# Patient Record
Sex: Male | Born: 1950 | Race: White | Hispanic: No | Marital: Married | Smoking: Never smoker
Health system: Southern US, Community
[De-identification: ages and names within clinical notes are randomized; demographics above are authoritative.]

## PROBLEM LIST (undated history)

## (undated) HISTORY — PX: NO PAST SURGERIES: SHX2092

---

## 2006-02-28 ENCOUNTER — Ambulatory Visit: Payer: Self-pay | Admitting: Family Medicine

## 2006-05-02 ENCOUNTER — Ambulatory Visit: Payer: Self-pay | Admitting: Gastroenterology

## 2018-03-17 ENCOUNTER — Other Ambulatory Visit: Payer: Self-pay

## 2018-03-17 ENCOUNTER — Ambulatory Visit (INDEPENDENT_AMBULATORY_CARE_PROVIDER_SITE_OTHER): Payer: Medicare Other

## 2018-03-17 ENCOUNTER — Ambulatory Visit
Admission: EM | Admit: 2018-03-17 | Discharge: 2018-03-17 | Disposition: A | Discharge status: Home / Self Care | Payer: Medicare Other | Attending: Emergency Medicine | Admitting: Emergency Medicine

## 2018-03-17 DIAGNOSIS — W01198A Fall on same level from slipping, tripping and stumbling with subsequent striking against other object, initial encounter: Secondary | ICD-10-CM

## 2018-03-17 DIAGNOSIS — Y92009 Unspecified place in unspecified non-institutional (private) residence as the place of occurrence of the external cause: Secondary | ICD-10-CM

## 2018-03-17 DIAGNOSIS — S20212A Contusion of left front wall of thorax, initial encounter: Secondary | ICD-10-CM | POA: Diagnosis not present

## 2018-03-17 DIAGNOSIS — W19XXXA Unspecified fall, initial encounter: Secondary | ICD-10-CM

## 2018-03-17 DIAGNOSIS — R0789 Other chest pain: Secondary | ICD-10-CM

## 2018-03-17 DIAGNOSIS — R0781 Pleurodynia: Secondary | ICD-10-CM

## 2018-03-17 DIAGNOSIS — Y92002 Bathroom of unspecified non-institutional (private) residence single-family (private) house as the place of occurrence of the external cause: Secondary | ICD-10-CM | POA: Diagnosis not present

## 2018-03-17 MED ORDER — IBUPROFEN 600 MG PO TABS: 600.0000 mg | ORAL_TABLET | Freq: Four times a day (QID) | ORAL | 0 refills | Status: AC | PRN

## 2018-03-17 MED ORDER — HYDROCODONE-ACETAMINOPHEN 5-325 MG PO TABS: 1.0000 | ORAL_TABLET | Freq: Four times a day (QID) | ORAL | 0 refills | Status: AC | PRN

## 2018-03-17 NOTE — Discharge Instructions (Addendum)
Your x-rays were negative for a displaced rib fracture or a collapsed lung.  Take the medication as written. Take 1 gram of tylenol with the motrin up to 4 times a day as needed for pain and fever. This with the is an effective combination for pain. Take the hydrocodone/norco only for severe pain. Do not take the tylenol and hydrocodone/norco as they both have tylenol in them and too much can hurt your liver. Do not exceed 4 grams of tylenol a day from all sources. Return to the ER if you get worse, have a  fever >100.4, or for any concerns.   Go to www.goodrx.com to look up your medications. This will give you a list of where you can find your prescriptions at the most affordable prices. Or ask the pharmacist what the cash price is, or if they have any other discount programs available to help make your medication more affordable. This can be less expensive than what you would pay with insurance.

## 2018-03-17 NOTE — ED Provider Notes (Signed)
HPI  SUBJECTIVE:  Joshua Mcmahon is a 67 y.o. male who presents with a slip and fall in the shower 4 days ago.  States he fell onto His left ribs.  Reports sharp, intermittent side and posterior left rib pain present only with movement, torso rotation, inspiration and cough.  States that the symptoms started last night.  He is not sure if he hit his head but he denies loss of consciousness, amnesia, nausea, vomiting, headache, photophobia.  No neck, chest, abdominal, back pain.  No coughing, wheezing, shortness of breath, fevers.  Patient states that he has noticed a "knot on his ribs" which is new.  He thinks that he may see some bruising.  He has not tried anything for this.  Symptoms are better with sitting still, worse with torso rotation, movement, deep inspiration.  Past medical history negative for diabetes, hypertension, osteoarthritis, anticoagulant antiplatelet use, kidney disease, GI bleed.  PMD: Kandyce Rud, MD   History reviewed. No pertinent past medical history.  Past Surgical History:  Procedure Laterality Date  . NO PAST SURGERIES      History reviewed. No pertinent family history.  Social History   Tobacco Use  . Smoking status: Never Smoker  . Smokeless tobacco: Current User    Types: Chew  Substance Use Topics  . Alcohol use: Yes    Comment: occasionally  . Drug use: Never    No current facility-administered medications for this encounter.   Current Outpatient Medications:  .  HYDROcodone-acetaminophen (NORCO/VICODIN) 5-325 MG tablet, Take 1-2 tablets by mouth every 6 (six) hours as needed for moderate pain or severe pain., Disp: 20 tablet, Rfl: 0 .  ibuprofen (ADVIL,MOTRIN) 600 MG tablet, Take 1 tablet (600 mg total) by mouth every 6 (six) hours as needed., Disp: 30 tablet, Rfl: 0  No Known Allergies   ROS  As noted in HPI.   Physical Exam  BP (!) 163/79 (BP Location: Left Arm)   Pulse 85   Temp 98.7 F (37.1 C) (Oral)   Resp 18   Ht 6\' 4"   (1.93 m)   Wt 265 lb (120.2 kg)   SpO2 98%   BMI 32.26 kg/m   Constitutional: Well developed, well nourished, no acute distress Eyes:  EOMI, conjunctiva normal bilaterally HENT: Normocephalic, atraumatic,mucus membranes moist Respiratory: Normal inspiratory effort.  Normal appearance of the chest wall.  No appreciable bruising.  Positive point tenderness over some of the middle and lower ribs in the midaxillary line and posteriorly.  No paradoxical chest wall motion.  Patient able to take a deep breath in.  Lungs clear bilaterally.  No other chest tenderness. Cardiovascular: Normal rate regular rhythm, no murmurs, rubs, gallops GI: nondistended skin: No rash, skin intact Musculoskeletal: no deformities Neurologic: Alert & oriented x 3, no focal neuro deficits Psychiatric: Speech and behavior appropriate   ED Course   Medications - No data to display  Orders Placed This Encounter  Procedures  . DG Ribs Unilateral W/Chest Left    Standing Status:   Standing    Number of Occurrences:   1    Order Specific Question:   Reason for Exam (SYMPTOM  OR DIAGNOSIS REQUIRED)    Answer:   Fall, trauma to the chest.  Tenderness over the posterior mid axillary line.  Rule out displaced rib fractures, rib fractures, pneumothorax.    No results found for this or any previous visit (from the past 24 hour(s)). Dg Ribs Unilateral W/chest Left  Result Date: 03/17/2018 CLINICAL  DATA:  Trauma to left side. Pain and tenderness since Sunday. EXAM: LEFT RIBS AND CHEST - 3+ VIEW COMPARISON:  None. FINDINGS: Frontal view chest and three views of left-sided ribs. Frontal view of the chest demonstrates midline trachea. Normal heart size. Atherosclerosis in the transverse aorta. No pleural effusion or pneumothorax. Mild right hemidiaphragm elevation. Left mid lobe scar or subsegmental atelectasis. Rib films demonstrate no displaced rib fracture. IMPRESSION: No displaced rib fractures, pleural fluid, or  pneumothorax. Aortic Atherosclerosis (ICD10-I70.0). Electronically Signed   By: Jeronimo GreavesKyle  Talbot M.D.   On: 03/17/2018 15:16    ED Clinical Impression  Contusion of rib on left side, initial encounter  Fall in home, initial encounter   ED Assessment/Plan   Narcotic database reviewed for this patient, and feel that the risk/benefit ratio today is favorable for proceeding with a prescription for controlled substance.  No Opiate prescriptions in the past 2 years  Reviewed imaging independently.  No displaced rib fractures, pleural fluid or pneumothorax.  See radiology report for full details. X-ray negative for fracture, pneumothorax.  Presentation consistent with either rib contusion or he may have a hairline fracture of the ribs.  Home with ibuprofen 600 mg to take with 1 g of Tylenol 3-4 times a day as needed for pain, Norco for severe pain.  Follow-up with PMD as needed, to the ER if he gets worse.  Discussed maging, MDM, plan and followup with patient. Discussed sn/sx that should prompt return to the ED. patient agrees with plan.   Meds ordered this encounter  Medications  . ibuprofen (ADVIL,MOTRIN) 600 MG tablet    Sig: Take 1 tablet (600 mg total) by mouth every 6 (six) hours as needed.    Dispense:  30 tablet    Refill:  0  . HYDROcodone-acetaminophen (NORCO/VICODIN) 5-325 MG tablet    Sig: Take 1-2 tablets by mouth every 6 (six) hours as needed for moderate pain or severe pain.    Dispense:  20 tablet    Refill:  0    *This clinic note was created using Scientist, clinical (histocompatibility and immunogenetics)Dragon dictation software. Therefore, there may be occasional mistakes despite careful proofreading.   ?   Domenick GongMortenson, Titilayo Hagans, MD 03/17/18 [phone]

## 2018-03-17 NOTE — ED Triage Notes (Signed)
Patient complains of left side rib pain that started hurting last night. Patient states that he was in [place] the [place] and slipped and fell in the cast iron tub. Patient states that he also has a knot on the back of his head. Patient reports that rib pain worsens with movement. Patient reports no pain from head bump.

## 2019-07-20 IMAGING — CR DG RIBS W/ CHEST 3+V*L*
5 series · 5 of 5 positions shown · non-contrast
Comparison: None.

CLINICAL DATA: Trauma to left side. Pain and tenderness since
[REDACTED].

EXAM:
LEFT RIBS AND CHEST - 3+ VIEW

[chest pa]
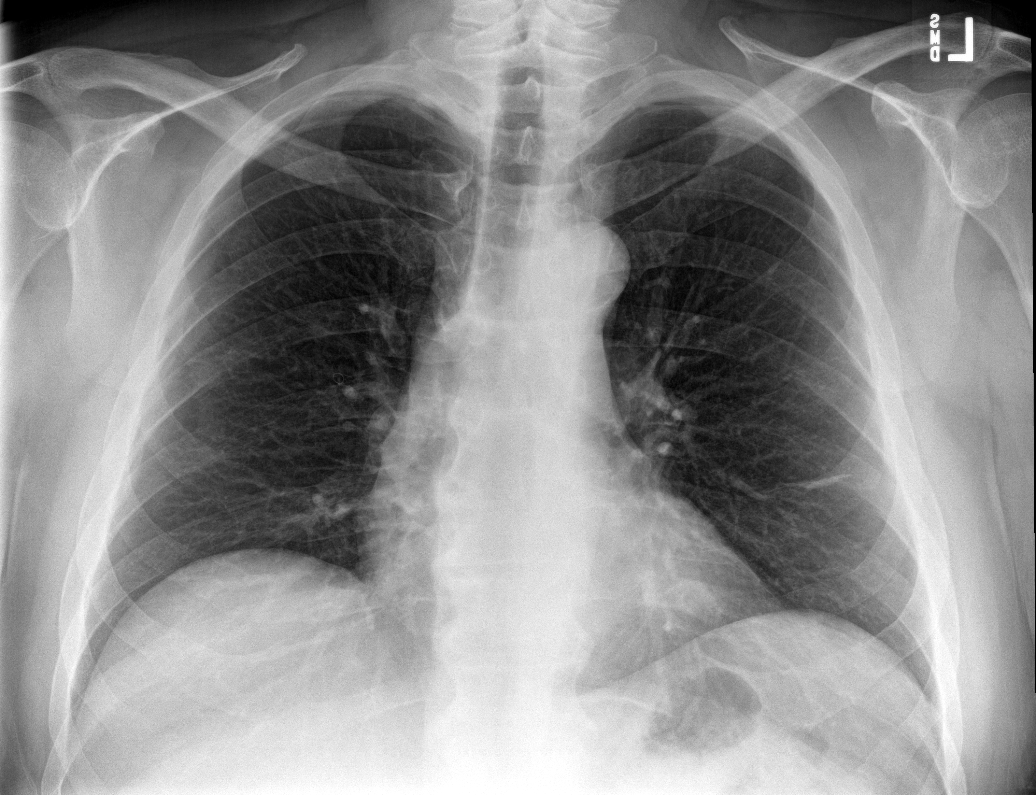

[rib pa (1 of 2)]
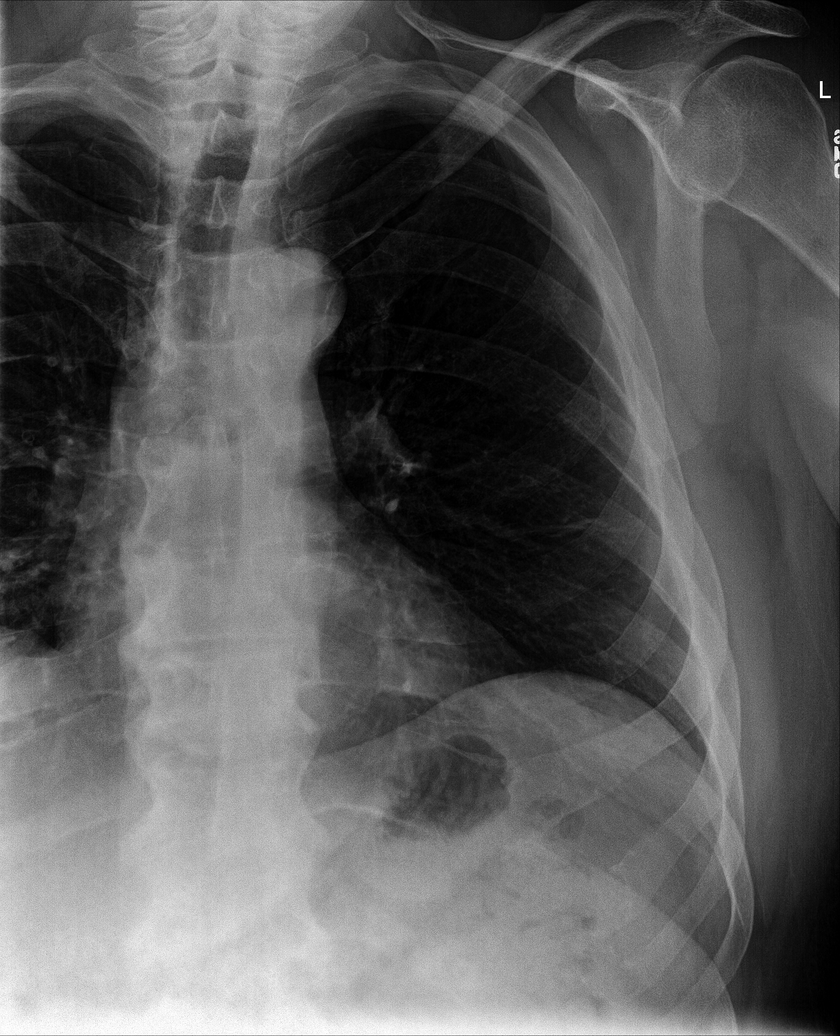

[rib pa (2 of 2)]
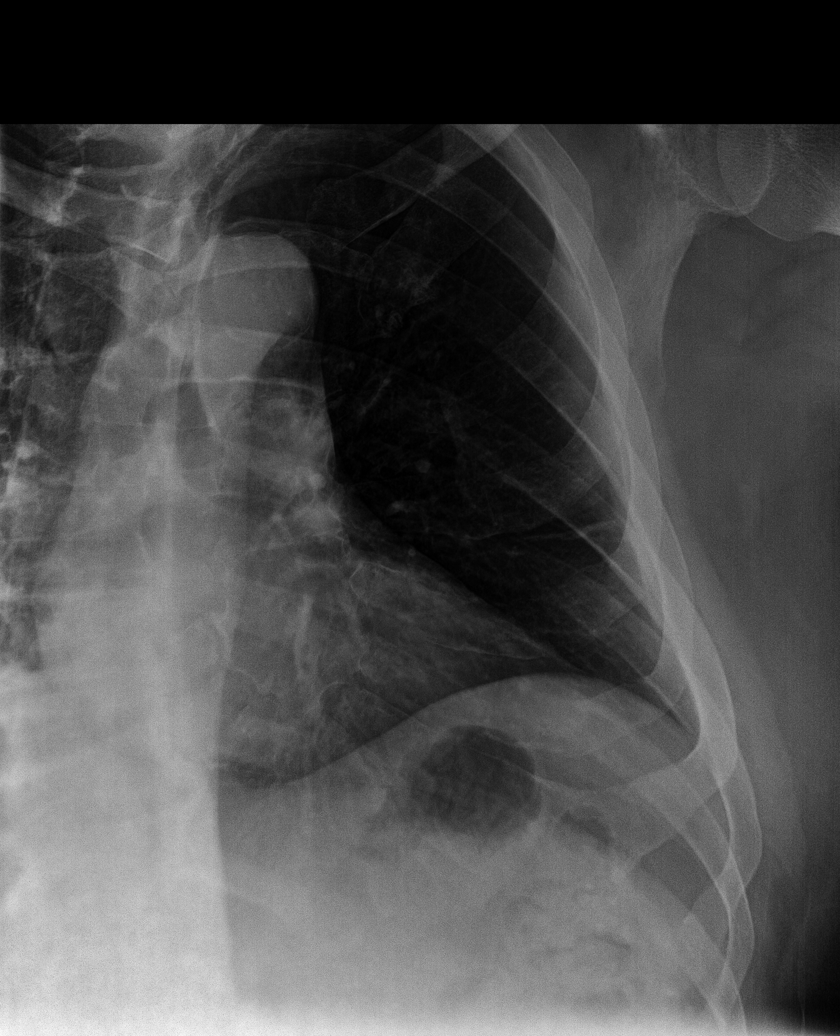

[rib obl (1 of 2)]
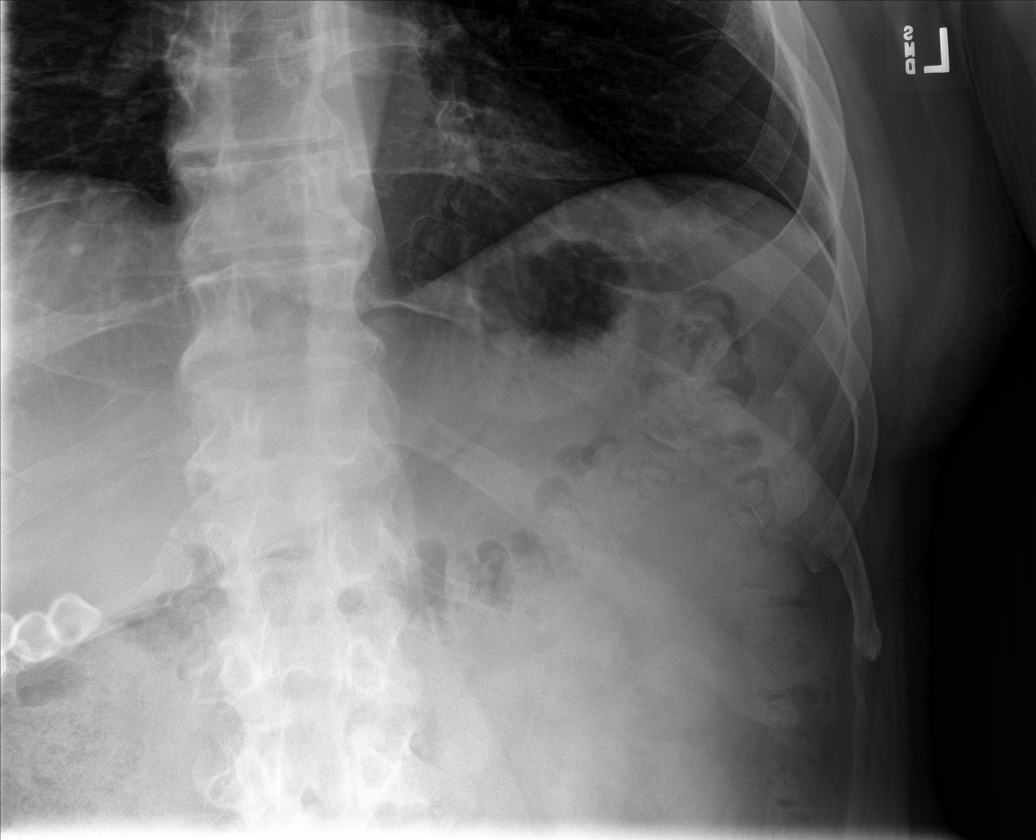

[rib obl (2 of 2)]
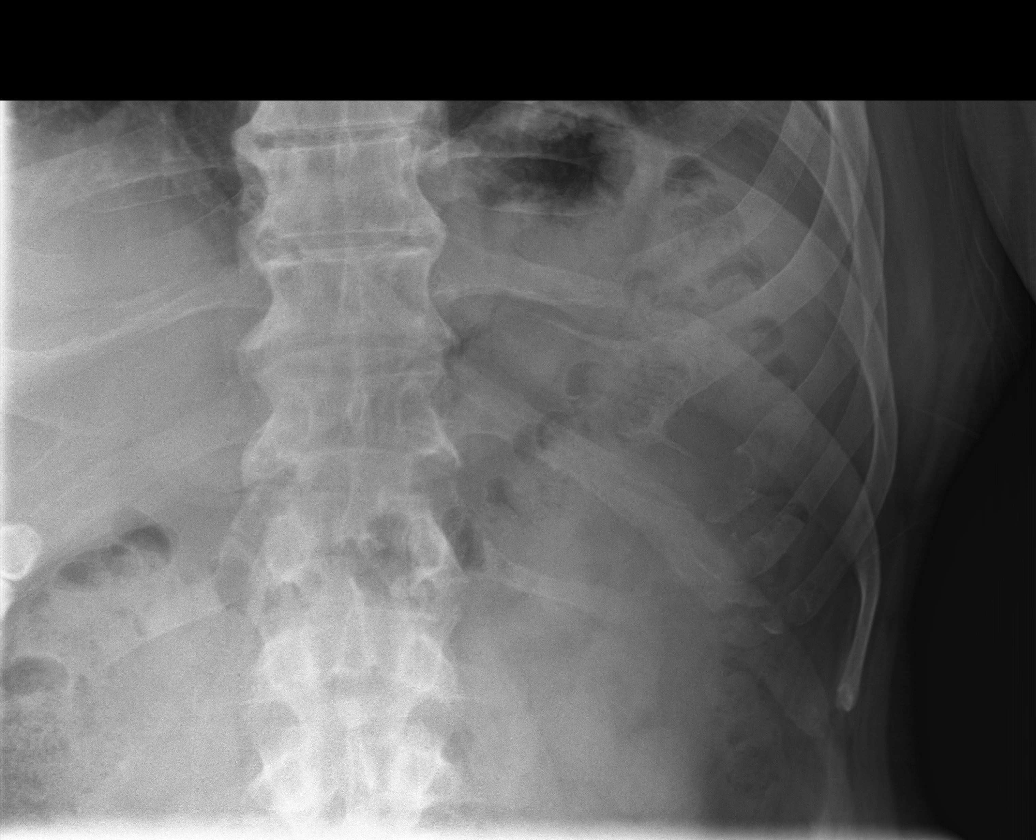

[5 of 5 positions shown; findings below may reference images not displayed]

FINDINGS: Frontal view chest and three views of left-sided ribs.

Frontal view of the chest demonstrates midline trachea. Normal heart
size. Atherosclerosis in the transverse aorta. No pleural effusion
or pneumothorax. Mild right hemidiaphragm elevation. Left mid lobe
scar or subsegmental atelectasis.

Rib films demonstrate no displaced rib fracture.
IMPRESSION: No displaced rib fractures, pleural fluid, or pneumothorax.

Aortic Atherosclerosis ([id number]).

## 2024-08-03 ENCOUNTER — Ambulatory Visit: Payer: Self-pay

## 2024-08-03 DIAGNOSIS — K621 Rectal polyp: Secondary | ICD-10-CM | POA: Diagnosis not present

## 2024-08-03 DIAGNOSIS — Z1211 Encounter for screening for malignant neoplasm of colon: Secondary | ICD-10-CM | POA: Diagnosis not present

## 2024-08-03 DIAGNOSIS — K640 First degree hemorrhoids: Secondary | ICD-10-CM | POA: Diagnosis not present
